# Patient Record
Sex: Female | Born: 1980 | Race: Black or African American | Hispanic: No | Marital: Single | Smoking: Current every day smoker
Health system: Southern US, Community
[De-identification: ages and names within clinical notes are randomized; demographics above are authoritative.]

## PROBLEM LIST (undated history)

## (undated) ENCOUNTER — Emergency Department (HOSPITAL_COMMUNITY): Admission: EM | Payer: Self-pay | Source: Home / Self Care

---

## 2018-11-26 ENCOUNTER — Emergency Department (HOSPITAL_COMMUNITY): Payer: Self-pay

## 2018-11-26 ENCOUNTER — Emergency Department (HOSPITAL_COMMUNITY)
Admission: EM | Admit: 2018-11-26 | Discharge: 2018-11-26 | Disposition: A | Discharge status: Home / Self Care | Payer: Self-pay | Attending: Emergency Medicine | Admitting: Emergency Medicine

## 2018-11-26 ENCOUNTER — Encounter (HOSPITAL_COMMUNITY): Payer: Self-pay

## 2018-11-26 DIAGNOSIS — F17210 Nicotine dependence, cigarettes, uncomplicated: Secondary | ICD-10-CM | POA: Insufficient documentation

## 2018-11-26 DIAGNOSIS — M79671 Pain in right foot: Secondary | ICD-10-CM | POA: Insufficient documentation

## 2018-11-26 NOTE — Discharge Instructions (Addendum)
This affected foot.  Take Tylenol or Motrin as needed for pain.  Follow-up with orthopedics in a week for persistent pain.  Return to the ED immediately for new or worsening symptoms, such as increased pain, numbness, decreased range of motion or any concerns at all.

## 2018-11-26 NOTE — ED Provider Notes (Signed)
[facility]-EMERGENCY DEPT Provider Note   CSN: [id number] Arrival date & time: 11/26/18  1213     History   Chief Complaint Chief Complaint  Patient presents with  . Foot Pain    HPI Joanne Rodriguez is a 38 y.o. female.  HPI   38 year old female presents with a 1 day history of right foot pain.  Patient states that a kitchen drawer fell on her foot last night.  Since then she has had pain to the top of her foot.  She notes pain is worse with ambulating.  She denies any difficulty ambulating, numbness, tingling, decreased range of motion.  She denies any ankle pain or toe pain.  She denies any other injuries.  History reviewed. No pertinent past medical history.  There are no active problems to display for this patient.   History reviewed. No pertinent surgical history.   OB History   No obstetric history on file.      Home Medications    Prior to Admission medications   Not on File    Family History History reviewed. No pertinent family history.  Social History Social History   Tobacco Use  . Smoking status: Current Every Day Smoker    Packs/day: 0.50    Types: Cigarettes  . Smokeless tobacco: Never Used  Substance Use Topics  . Alcohol use: Yes    Alcohol/week: 3.0 standard drinks    Types: 3 Cans of beer per week  . Drug use: Not Currently     Allergies   Patient has no allergy information on record.   Review of Systems Review of Systems  Constitutional: Negative for chills and fever.  Respiratory: Negative for shortness of breath.   Cardiovascular: Negative for chest pain.  Gastrointestinal: Negative for abdominal pain, nausea and vomiting.  Musculoskeletal: Positive for joint swelling (pain to the top of the right foot). Negative for gait problem.     Physical Exam Updated Vital Signs BP 111/73 (BP Location: Right Arm)   Pulse 84   Temp 98.1 F (36.7 C) (Oral)   Resp 16   LMP 11/24/2018   SpO2 100%   Physical  Exam Vitals signs and nursing note reviewed.  Constitutional:      Appearance: She is well-developed.  HENT:     Head: Normocephalic and atraumatic.  Eyes:     Conjunctiva/sclera: Conjunctivae normal.  Neck:     Musculoskeletal: Neck supple.  Cardiovascular:     Rate and Rhythm: Normal rate and regular rhythm.     Pulses:          Dorsalis pedis pulses are 3+ on the right side and 3+ on the left side.     Heart sounds: Normal heart sounds. No murmur.  Pulmonary:     Effort: Pulmonary effort is normal. No respiratory distress.     Breath sounds: Normal breath sounds. No wheezing or rales.  Abdominal:     General: Bowel sounds are normal. There is no distension.     Palpations: Abdomen is soft.     Tenderness: There is no abdominal tenderness.  Musculoskeletal: Normal range of motion.        General: No tenderness or deformity.     Right ankle: She exhibits normal range of motion, no swelling, no ecchymosis and no deformity.     Right foot: Normal range of motion. No deformity or foot drop.     Left foot: Normal range of motion. No deformity or foot drop.  Feet:  Right foot:     Skin integrity: Erythema (small area of ecchymosis to the top of the foot at mid 2nd metatarsal approx 2x3cm) present.  Skin:    General: Skin is warm and dry.     Findings: No erythema or rash.  Neurological:     Mental Status: She is alert and oriented to person, place, and time.  Psychiatric:        Behavior: Behavior normal.      ED Treatments / Results  Labs (all labs ordered are listed, but only abnormal results are displayed) Labs Reviewed - No data to display  EKG None  Radiology Dg Foot Complete Right  Result Date: 11/26/2018 CLINICAL DATA:  Kitchen drawer full of silverware fell onto top of RIGHT foot, pain and swelling over the metatarsals EXAM: RIGHT FOOT COMPLETE - 3+ VIEW COMPARISON:  None FINDINGS: Osseous mineralization normal. Joint spaces preserved. No acute fracture,  dislocation, or bone destruction. IMPRESSION: No acute osseous abnormalities. Electronically Signed   By: Ulyses SouthwardMark  Boles M.D.   On: 11/26/2018 13:20    Procedures Procedures (including critical care time)  Medications Ordered in ED Medications - No data to display   Initial Impression / Assessment and Plan / ED Course  I have reviewed the triage vital signs and the nursing notes.  Pertinent labs & imaging results that were available during my care of the patient were reviewed by me and considered in my medical decision making (see chart for details).     Presented with right foot pain since last night.  Patient resting comfortably in bed, no acute distress, nontoxic, non-lethargic.  Vital signs stable.  Patient has a small area of ecchymosis to the top of her right foot.  X-ray of right foot shows no acute fractures or dislocations.  Patient has no break in the skin, no palpable deformities.  She is nontender over the right ankle and right toes with full range of motion of joints.  She is neurovascularly intact.  Encouraged RICE.  Expressed understanding.  She is ready and stable for discharge.  Final Clinical Impressions(s) / ED Diagnoses   Final diagnoses:  None    ED Discharge Orders    None       Rueben BashKendrick, Kadience Macchi S, PA-C 11/26/18 2153    Donnetta Hutchingook, Brian, MD 11/27/18 [phone]

## 2018-11-26 NOTE — ED Triage Notes (Signed)
Patient c/o right foot pain.  Patient states last night a kitchen drawer fell on top of her right foot.   7/10 pain (throbbing/burning)  A/Ox4 Ambulatory in triage.

## 2019-01-19 ENCOUNTER — Encounter (HOSPITAL_COMMUNITY): Payer: Self-pay

## 2019-01-19 ENCOUNTER — Emergency Department (HOSPITAL_COMMUNITY)
Admission: EM | Admit: 2019-01-19 | Discharge: 2019-01-19 | Disposition: A | Discharge status: Home / Self Care | Payer: Self-pay | Attending: Emergency Medicine | Admitting: Emergency Medicine

## 2019-01-19 ENCOUNTER — Other Ambulatory Visit: Payer: Self-pay

## 2019-01-19 DIAGNOSIS — Y929 Unspecified place or not applicable: Secondary | ICD-10-CM | POA: Insufficient documentation

## 2019-01-19 DIAGNOSIS — Y939 Activity, unspecified: Secondary | ICD-10-CM | POA: Insufficient documentation

## 2019-01-19 DIAGNOSIS — F17210 Nicotine dependence, cigarettes, uncomplicated: Secondary | ICD-10-CM | POA: Insufficient documentation

## 2019-01-19 DIAGNOSIS — Y999 Unspecified external cause status: Secondary | ICD-10-CM | POA: Insufficient documentation

## 2019-01-19 DIAGNOSIS — S025XXA Fracture of tooth (traumatic), initial encounter for closed fracture: Secondary | ICD-10-CM | POA: Insufficient documentation

## 2019-01-19 DIAGNOSIS — K0889 Other specified disorders of teeth and supporting structures: Secondary | ICD-10-CM | POA: Insufficient documentation

## 2019-01-19 DIAGNOSIS — X58XXXA Exposure to other specified factors, initial encounter: Secondary | ICD-10-CM | POA: Insufficient documentation

## 2019-01-19 MED ORDER — AMOXICILLIN 500 MG PO CAPS: 500.0000 mg | ORAL_CAPSULE | Freq: Three times a day (TID) | ORAL | 0 refills | Status: AC

## 2019-01-19 MED ORDER — AMOXICILLIN 500 MG PO CAPS
500.0000 mg | ORAL_CAPSULE | Freq: Once | ORAL | Status: AC
  Administered 2019-01-19: 500 mg via ORAL
  Filled 2019-01-19: qty 1

## 2019-01-19 MED ORDER — LIDOCAINE VISCOUS HCL 2 % MT SOLN: 15.0000 mL | OROMUCOSAL | 0 refills | Status: AC | PRN

## 2019-01-19 MED ORDER — KETOROLAC TROMETHAMINE 60 MG/2ML IM SOLN
60.0000 mg | Freq: Once | INTRAMUSCULAR | Status: AC
  Administered 2019-01-19: 60 mg via INTRAMUSCULAR
  Filled 2019-01-19: qty 2

## 2019-01-19 NOTE — ED Notes (Signed)
Patient verbalizes understanding of discharge instructions. Opportunity for questioning and answers were provided. Armband removed by staff, pt discharged from ED ambulatory to home.  

## 2019-01-19 NOTE — ED Provider Notes (Signed)
[facility] EMERGENCY DEPARTMENT Provider Note   CSN: [id number] Arrival date & time: 01/19/19  2042    History   Chief Complaint Chief Complaint  Patient presents with  . Dental Pain    HPI Joanne Rodriguez is a 38 y.o. female with no pertinent past medical history who presents to the emergency department with a chief complaint of dental pain.  The patient reports part of her right upper tooth broke off 2 days ago.  She reports that she purchased an over-the-counter dental filler and attempted to apply it to the tooth.  She reports that over the last day that her pain has significantly worsened.  She states that she has been unable to fully close her mouth or chew on the right side of her mouth due to the pain.  She has been able to swallow liquids without difficulty.  She denies fevers, chills, purulent drainage, trismus, muffled voice, sore throat, dysphagia, facial or neck swelling.  She has been treating her symptoms by taking 1500 mg of Tylenol 3 times over the last 24 hours.  She reports he was taking Aleve yesterday.  She has been applying warm compresses to the right side of her face with mild improvement of her symptoms.     The history is provided by the patient. No language interpreter was used.    History reviewed. No pertinent past medical history.  There are no active problems to display for this patient.   History reviewed. No pertinent surgical history.   OB History   No obstetric history on file.      Home Medications    Prior to Admission medications   Medication Sig Start Date End Date Taking? Authorizing Provider  amoxicillin (AMOXIL) 500 MG capsule Take 1 capsule (500 mg total) by mouth 3 (three) times daily for 5 days. 01/19/19 01/24/19  ,  A, PA-C  lidocaine (XYLOCAINE) 2 % solution Use as directed 15 mLs in the mouth or throat every 3 (three) hours as needed for mouth pain. 01/19/19   , Coral Else, PA-C    Family  History History reviewed. No pertinent family history.  Social History Social History   Tobacco Use  . Smoking status: Current Every Day Smoker    Packs/day: 0.50    Types: Cigarettes  . Smokeless tobacco: Never Used  Substance Use Topics  . Alcohol use: Yes    Alcohol/week: 3.0 standard drinks    Types: 3 Cans of beer per week  . Drug use: Not Currently     Allergies   Patient has no known allergies.   Review of Systems Review of Systems  Constitutional: Negative for activity change, chills and fever.  HENT: Positive for dental problem. Negative for facial swelling, sore throat and trouble swallowing.   Respiratory: Negative for shortness of breath.   Cardiovascular: Negative for chest pain.  Gastrointestinal: Negative for abdominal pain.  Musculoskeletal: Negative for back pain, neck pain and neck stiffness.  Skin: Negative for rash.     Physical Exam Updated Vital Signs BP (!) 140/97   Pulse 68   Temp 98.7 F (37.1 C) (Oral)   Resp 16   Ht 5\' 7"  (1.702 m)   Wt 70.3 kg   SpO2 100%   BMI 24.28 kg/m   Physical Exam Vitals signs and nursing note reviewed.  Constitutional:      General: She is not in acute distress. HENT:     Head: Normocephalic.     Mouth/Throat:  Mouth: Mucous membranes are moist.     Dentition: Abnormal dentition. Dental tenderness and gingival swelling present. No dental abscesses.     Pharynx: Oropharynx is clear. Uvula midline. No pharyngeal swelling, oropharyngeal exudate, posterior oropharyngeal erythema or uvula swelling.     Tonsils: No tonsillar exudate or tonsillar abscesses.      Comments: White filler material present to tooth 1 and 2. Tooth 1 had sealant that protrudes over the side of the tooth, superiorly toward the gumline.  Gingivitis present around tooth 1 and 2. No gross dental abscess.  Posterior oropharynx is unremarkable.  Good range of motion of the jaw.  She is able to fully bite down with incisors, but less than  1 to 2 mm being able to fully bite down with her molars.  No submental induration. Eyes:     Conjunctiva/sclera: Conjunctivae normal.  Neck:     Musculoskeletal: Normal range of motion and neck supple. No neck rigidity or muscular tenderness.  Cardiovascular:     Rate and Rhythm: Normal rate and regular rhythm.     Heart sounds: No murmur. No friction rub. No gallop.   Pulmonary:     Effort: Pulmonary effort is normal. No respiratory distress.  Abdominal:     General: There is no distension.     Palpations: Abdomen is soft.  Lymphadenopathy:     Cervical: No cervical adenopathy.  Skin:    General: Skin is warm.     Findings: No rash.  Neurological:     Mental Status: She is alert.  Psychiatric:        Behavior: Behavior normal.      ED Treatments / Results  Labs (all labs ordered are listed, but only abnormal results are displayed) Labs Reviewed - No data to display  EKG None  Radiology No results found.  Procedures Procedures (including critical care time)  Medications Ordered in ED Medications  ketorolac (TORADOL) injection 60 mg (60 mg Intramuscular Given 01/19/19 2238)  amoxicillin (AMOXIL) capsule 500 mg (500 mg Oral Given 01/19/19 2238)     Initial Impression / Assessment and Plan / ED Course  I have reviewed the triage vital signs and the nursing notes.  Pertinent labs & imaging results that were available during my care of the patient were reviewed by me and considered in my medical decision making (see chart for details).        38 year old female with no pertinent past medical history presenting with 2 days of dental pain after fracturing a right upper maxillary tooth.  She then applied an over-the-counter sealant to that tooth as well as the adjacent tooth.  On exam, gingival irritation is noted surrounding teeth 1 and 2.  No gross dental abscess.  She is able to tolerate liquids and reports significant improvement in her pain since taking Tylenol  earlier today and removing heat compress from her face.  She has no trismus, muffled voice, dysphagia, or gross dental abscess. Exam unconcerning for Ludwig's angina or spread of infection.  Will treat with amoxacillin and anti-inflammatories.  Patient was offered dental block in the ER, which she declined.  She was treated with Toradol and her first dose of amoxicillin.  She was also counseled on maximum daily Tylenol dosing. Urged patient to follow-up with dentist.  She is hemodynamically stable and in no acute distress.  Return precautions given.  She is safe for discharge home with outpatient follow-up at this time.   Final Clinical Impressions(s) / ED Diagnoses  Final diagnoses:  Dentalgia  Closed fracture of tooth, initial encounter    ED Discharge Orders         Ordered    amoxicillin (AMOXIL) 500 MG capsule  3 times daily     01/19/19 2239    lidocaine (XYLOCAINE) 2 % solution  Every  3 hours PRN     01/19/19 2239           ,  A, PA-C 01/19/19 2249    Vanetta Mulders, MD 01/20/19 [phone]

## 2019-01-19 NOTE — Discharge Instructions (Addendum)
Thank you for allowing me to care for you today in the Emergency Department.   For pain control, you can take 600 mg of ibuprofen with food or 650 mg of Tylenol once every 6 hours for pain control or alternate between these 2 medications every 3 hours.  You can use a 15 mL of lidocaine every 3 hours as needed help with pain in the mouth.  Do not use this medication more than directed due to side effects.  Apply an ice pack for 15 to 20 minutes to areas that are painful as frequently as needed.  Avoid applying heat to the face because although it may initially help with pain and may make her swelling worse.  Do not take more than 4000 mg of Tylenol in a 24-hour period because this can be harmful to your liver.  Swish and spit warm salt water every 6 hours to help prevent worsening infection.  Take 1 tablet of amoxicillin every 8 hours for the next 5 days.  Your first dose was given tonight in the ER.  Make sure to take the entire course of amoxicillin even if you feel as if your symptoms improve unless you are otherwise directed by dentist.  Call Dr. Lacretia Leigh office or use the provided dental resource guide to schedule follow-up appointment with a dentist.  You should return to the emergency department if you develop a high fever, if you feel as if your throat is closing, if you start to have thick, mucus-like drainage from the area, if you develop significant swelling to one side of your neck, or other new, concerning symptoms.

## 2019-01-19 NOTE — ED Triage Notes (Signed)
Pt arrives POV for eval of R sided mouth/dental pain x 2 days. Pt reports chipped tooth and she attempted to fill it w/ temp filler, states pain has worsened and she is unable to eat or close mouth fully. Pt in NARD, managing own secretions in triage.

## 2019-02-24 ENCOUNTER — Other Ambulatory Visit: Payer: Self-pay

## 2019-02-24 ENCOUNTER — Emergency Department (HOSPITAL_COMMUNITY)
Admission: EM | Admit: 2019-02-24 | Discharge: 2019-02-24 | Disposition: A | Discharge status: Home / Self Care | Payer: Self-pay | Attending: Emergency Medicine | Admitting: Emergency Medicine

## 2019-02-24 DIAGNOSIS — F17210 Nicotine dependence, cigarettes, uncomplicated: Secondary | ICD-10-CM | POA: Insufficient documentation

## 2019-02-24 DIAGNOSIS — K047 Periapical abscess without sinus: Secondary | ICD-10-CM | POA: Insufficient documentation

## 2019-02-24 DIAGNOSIS — K029 Dental caries, unspecified: Secondary | ICD-10-CM

## 2019-02-24 MED ORDER — HYDROCODONE-ACETAMINOPHEN 5-325 MG PO TABS
1.0000 | ORAL_TABLET | Freq: Once | ORAL | Status: AC
  Administered 2019-02-24: 1 via ORAL
  Filled 2019-02-24: qty 1

## 2019-02-24 MED ORDER — AMOXICILLIN 500 MG PO CAPS
500.0000 mg | ORAL_CAPSULE | Freq: Once | ORAL | Status: AC
  Administered 2019-02-24: 500 mg via ORAL
  Filled 2019-02-24: qty 1

## 2019-02-24 MED ORDER — AMOXICILLIN 500 MG PO CAPS: 500.0000 mg | ORAL_CAPSULE | Freq: Three times a day (TID) | ORAL | 0 refills | Status: AC

## 2019-02-24 MED ORDER — HYDROCODONE-ACETAMINOPHEN 5-325 MG PO TABS: 2.0000 | ORAL_TABLET | ORAL | 0 refills | Status: AC | PRN

## 2019-02-24 NOTE — Discharge Instructions (Signed)
Your evaluated today for dental pain.  You do have a dental abscess to your right upper molar.  This is actively draining and we did not need to drain this any further in the emergency department.  I given you antibiotics as well as pain medication.  Please take as prescribed.  Please note that pain medication is addictive in nature.  Please only take this as absolutely necessary.  Do not take any additional Tylenol when you are taking his pain medication as this medication has Tylenol in it.  You may continue take ibuprofen as needed for swelling and pain.  I have given you resources for dentistry.  Please call them to schedule follow-up appointment.  Return to the ED for any new or worsening symptoms.

## 2019-02-24 NOTE — ED Triage Notes (Signed)
Pt arrives to ED with c/o of right dental abscess and pain. Pt has pain that radiates into eye and ear.

## 2019-02-24 NOTE — ED Notes (Signed)
Patient verbalizes understanding of discharge instructions. Opportunity for questioning and answers were provided. Armband removed by staff, pt discharged from ED ambulatory to home.  

## 2019-02-24 NOTE — ED Provider Notes (Signed)
[facility] EMERGENCY DEPARTMENT Provider Note   CSN: [id number] Arrival date & time: 02/24/19  1443  History   Chief Complaint Chief Complaint  Patient presents with  . Dental Pain   HPI Joanne Rodriguez is a 38 y.o. female with no significant past medical history who presents for evaluation of dental pain. States she has dental pain located to 2 areas. Patient with dental pain located to her right upper molar as well as her left lower molar.  Patient with known fractured teeth to both these locations.  Patient states she noted area to her right upper jaw was oozing pus and blood yesterday evening.  Patient states she has been able to keep pressing on this area to express this discharge.  Patient states she has pain located to this area which she rates a 7/10.  Patient states that she feels like this pain will sometimes shoot up her face and cause a headache especially when she drinks hot or cold liquids.  Patient states she is not followed by dentistry.  Patient states she did recently try to put dental plaster to her left lower tooth is minimally helped with the pain.  Radiation of pain.  Denies fever, chills, nausea, vomiting, neck pain, neck stiffness, facial asymmetry, drooling, dysphasia, trismus, chest pain or shortness of breath.  Has not taken anything for pain PTA.  Denies additional aggravating or alleviating factors.  History obtained from patient.  No interpreter was used.     HPI  No past medical history on file.  There are no active problems to display for this patient.   No past surgical history on file.   OB History   No obstetric history on file.      Home Medications    Prior to Admission medications   Medication Sig Start Date End Date Taking? Authorizing Provider  amoxicillin (AMOXIL) 500 MG capsule Take 1 capsule (500 mg total) by mouth 3 (three) times daily for 7 days. 02/24/19 03/03/19  Aleshia Cartelli A, PA-C  HYDROcodone-acetaminophen  (NORCO/VICODIN) 5-325 MG tablet Take 2 tablets by mouth every 4 (four) hours as needed. 02/24/19   Delorean Knutzen A, PA-C  lidocaine (XYLOCAINE) 2 % solution Use as directed 15 mLs in the mouth or throat every 3 (three) hours as needed for mouth pain. 01/19/19   McDonald, Mia A, PA-C    Family History No family history on file.  Social History Social History   Tobacco Use  . Smoking status: Current Every Day Smoker    Packs/day: 0.50    Types: Cigarettes  . Smokeless tobacco: Never Used  Substance Use Topics  . Alcohol use: Yes    Alcohol/week: 3.0 standard drinks    Types: 3 Cans of beer per week  . Drug use: Not Currently     Allergies   Patient has no known allergies.   Review of Systems Review of Systems  Constitutional: Negative.   HENT: Positive for dental problem. Negative for drooling, ear discharge, ear pain, facial swelling, hearing loss, mouth sores, postnasal drip, rhinorrhea, sinus pressure, sinus pain, sneezing, sore throat, tinnitus and trouble swallowing.   Eyes: Negative.   Respiratory: Negative.   Cardiovascular: Negative.   Gastrointestinal: Negative.   Genitourinary: Negative.   Musculoskeletal: Negative.   Skin: Negative.   Neurological: Negative.   All other systems reviewed and are negative.    Physical Exam Updated Vital Signs BP (!) 143/94 (BP Location: Right Arm)   Pulse 71   Temp 98.5  F (36.9 C) (Oral)   Resp 18   SpO2 100%   Physical Exam Vitals signs and nursing note reviewed.  Constitutional:      General: She is not in acute distress.    Appearance: She is well-developed. She is not ill-appearing, toxic-appearing or diaphoretic.  HENT:     Head: Normocephalic and atraumatic.     Jaw: There is normal jaw occlusion.     Comments: No facial swelling. No drooling, dysphasia or trismus.  No facial tenderness.    Right Ear: Tympanic membrane, ear canal and external ear normal. No laceration, drainage, swelling or tenderness.  Tympanic membrane is not scarred, perforated, erythematous, retracted or bulging.     Left Ear: Tympanic membrane, ear canal and external ear normal. No laceration, drainage, swelling or tenderness. Tympanic membrane is not scarred, perforated, erythematous, retracted or bulging.     Nose: Nose normal.     Right Sinus: No maxillary sinus tenderness or frontal sinus tenderness.     Left Sinus: No maxillary sinus tenderness or frontal sinus tenderness.     Mouth/Throat:      Comments: Oropharynx clear.  Mucous membranes moist.  Uvula midline without deviation.  No evidence of tonsillar edema or exudate.  Overall poor dentition with multiple dental caries and fractured teeth. There is actively draining periapical abscess surrounding tooth #2. Mild gingival erythema.  She does have a fractured tooth at this location.  Fractured teeth tooth #18 and 19.  No evidence of gingival erythema.  Periapical abscess to this area.  Sublingual area soft.  No submandibular swelling. Eyes:     Pupils: Pupils are equal, round, and reactive to light.  Neck:     Musculoskeletal: Normal range of motion.     Comments: No neck stiffness or neck rigidity.  No meningismus.  Phonation normal. Cardiovascular:     Rate and Rhythm: Normal rate.     Pulses: Normal pulses.     Heart sounds: Normal heart sounds.  Pulmonary:     Effort: No respiratory distress.     Comments: Clear to auscultation bilateral without wheeze, rhonchi or rales. Abdominal:     General: There is no distension.  Musculoskeletal: Normal range of motion.  Skin:    General: Skin is warm and dry.     Comments: No rashes or lesions.  Neurological:     Mental Status: She is alert.     Comments: No facial droop. Cranial nerves II through XII grossly intact. Ambulatory in department that difficulty.    ED Treatments / Results  Labs (all labs ordered are listed, but only abnormal results are displayed) Labs Reviewed - No data to display  EKG None   Radiology No results found.  Procedures Procedures (including critical care time)  Medications Ordered in ED Medications  HYDROcodone-acetaminophen (NORCO/VICODIN) 5-325 MG per tablet 1 tablet (1 tablet Oral Given 02/24/19 1531)  amoxicillin (AMOXIL) capsule 500 mg (500 mg Oral Given 02/24/19 1531)    Initial Impression / Assessment and Plan / ED Course  I have reviewed the triage vital signs and the nursing notes.  Pertinent labs & imaging results that were available during my care of the patient were reviewed by me and considered in my medical decision making (see chart for details).  38 year old female appears otherwise well presents for evaluation of dental pain.  Afebrile, nonseptic, non-ill-appearing.  No facial swelling. No headache currently. No drooling, dysphasia or trismus.  Posterior oropharynx clear.  Mucous membranes moist.  Uvula midline  without deviation.  No facial rashes or tenderness to palpation.  Patient with actively draining periapical abscess to tooth number 2.  She also has multiple fractured teeth and overall poor dentition.  She has tenderness surrounding tooth #19 and 18 however does not have any evidence of periapical abscess to this area.  Gingiva without erythema to this area.  Sublingual area soft.  No submandibular swelling. Low suspicion for deep space infection or Ludwig's angina.  Given abscess is actively draining.  Do not feel we need to actively I&D this area.  Able to tolerate p.o. intake without difficulty.  Will DC home with pain medication, antibiotics as well as dental resources.  Reviewed [facility] narcotics database and she does not have an active narcotic prescription.  Patient hemodynamically stable and appropriate for DC home this time.  I have discussed strict return precautions.  Patient voiced understanding and is agreeable to follow-up.     Final Clinical Impressions(s) / ED Diagnoses   Final diagnoses:  Periapical abscess  Dental  caries    ED Discharge Orders         Ordered    HYDROcodone-acetaminophen (NORCO/VICODIN) 5-325 MG tablet  Every 4 hours PRN     02/24/19 1528    amoxicillin (AMOXIL) 500 MG capsule  3 times daily     02/24/19 1528           Quintel Mccalla A, PA-C 02/24/19 1544    Benjiman Core, MD 02/25/19 1133

## 2020-02-26 IMAGING — CR DG FOOT COMPLETE 3+V*R*
3 series · 3 of 3 positions shown · non-contrast
Comparison: None

CLINICAL DATA: Kitchen drawer full of silverware fell onto top of
RIGHT foot, pain and swelling over the metatarsals

EXAM:
RIGHT FOOT COMPLETE - 3+ VIEW

[x foot ap right]
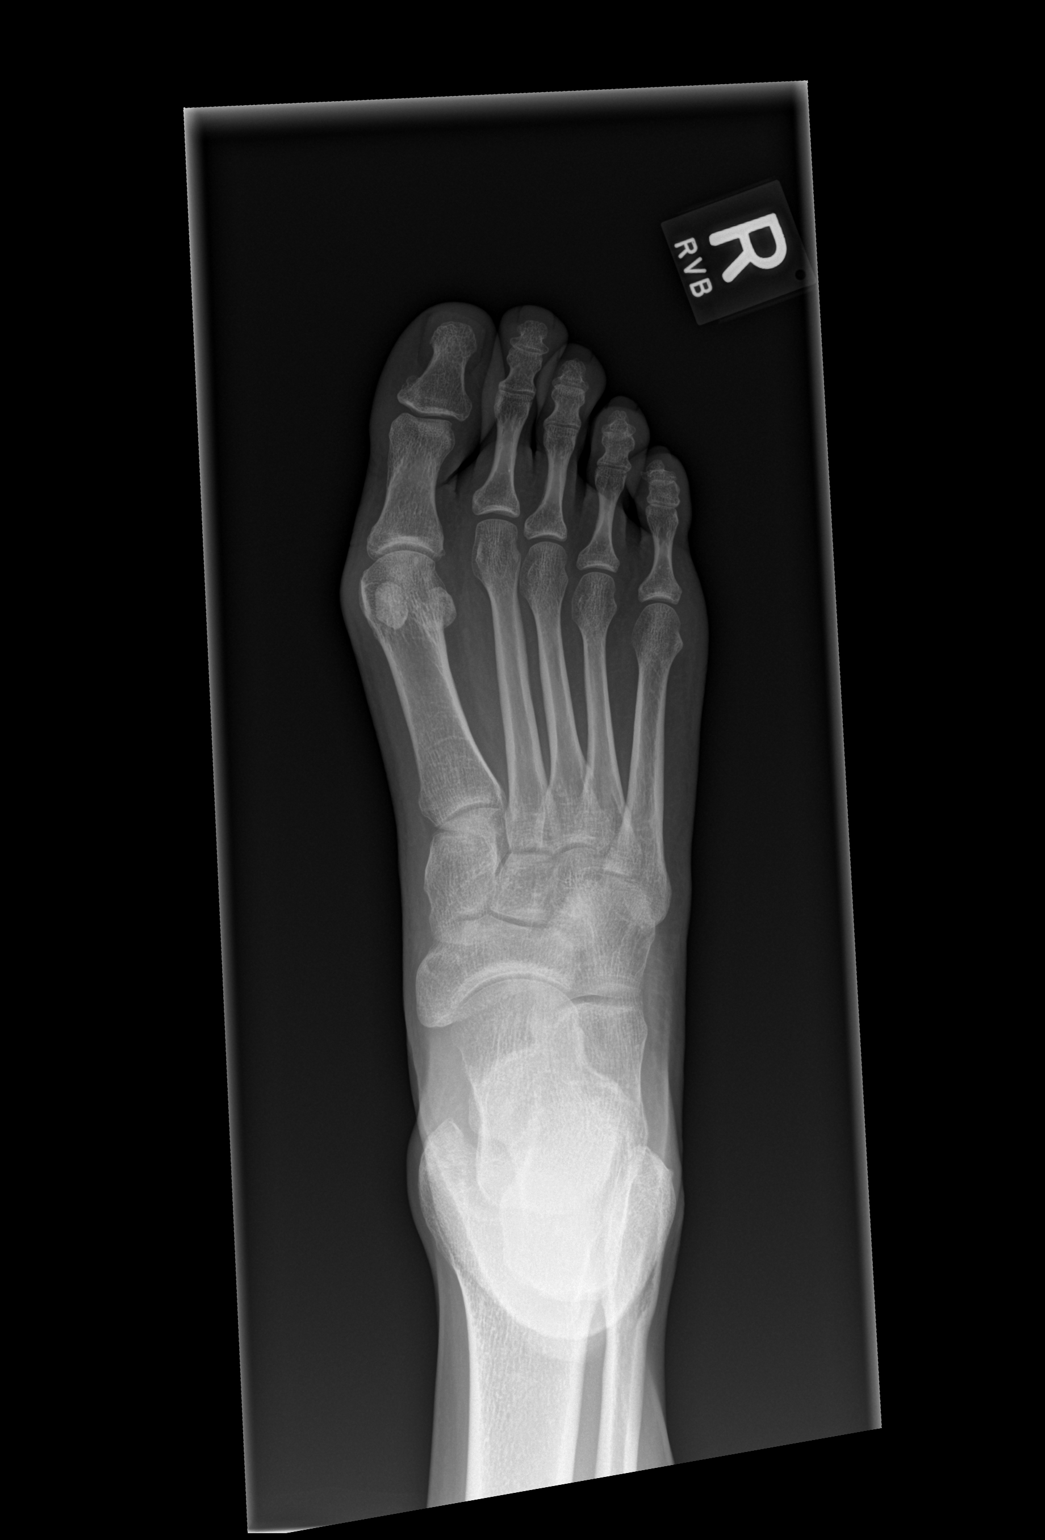

[x foot obl right]
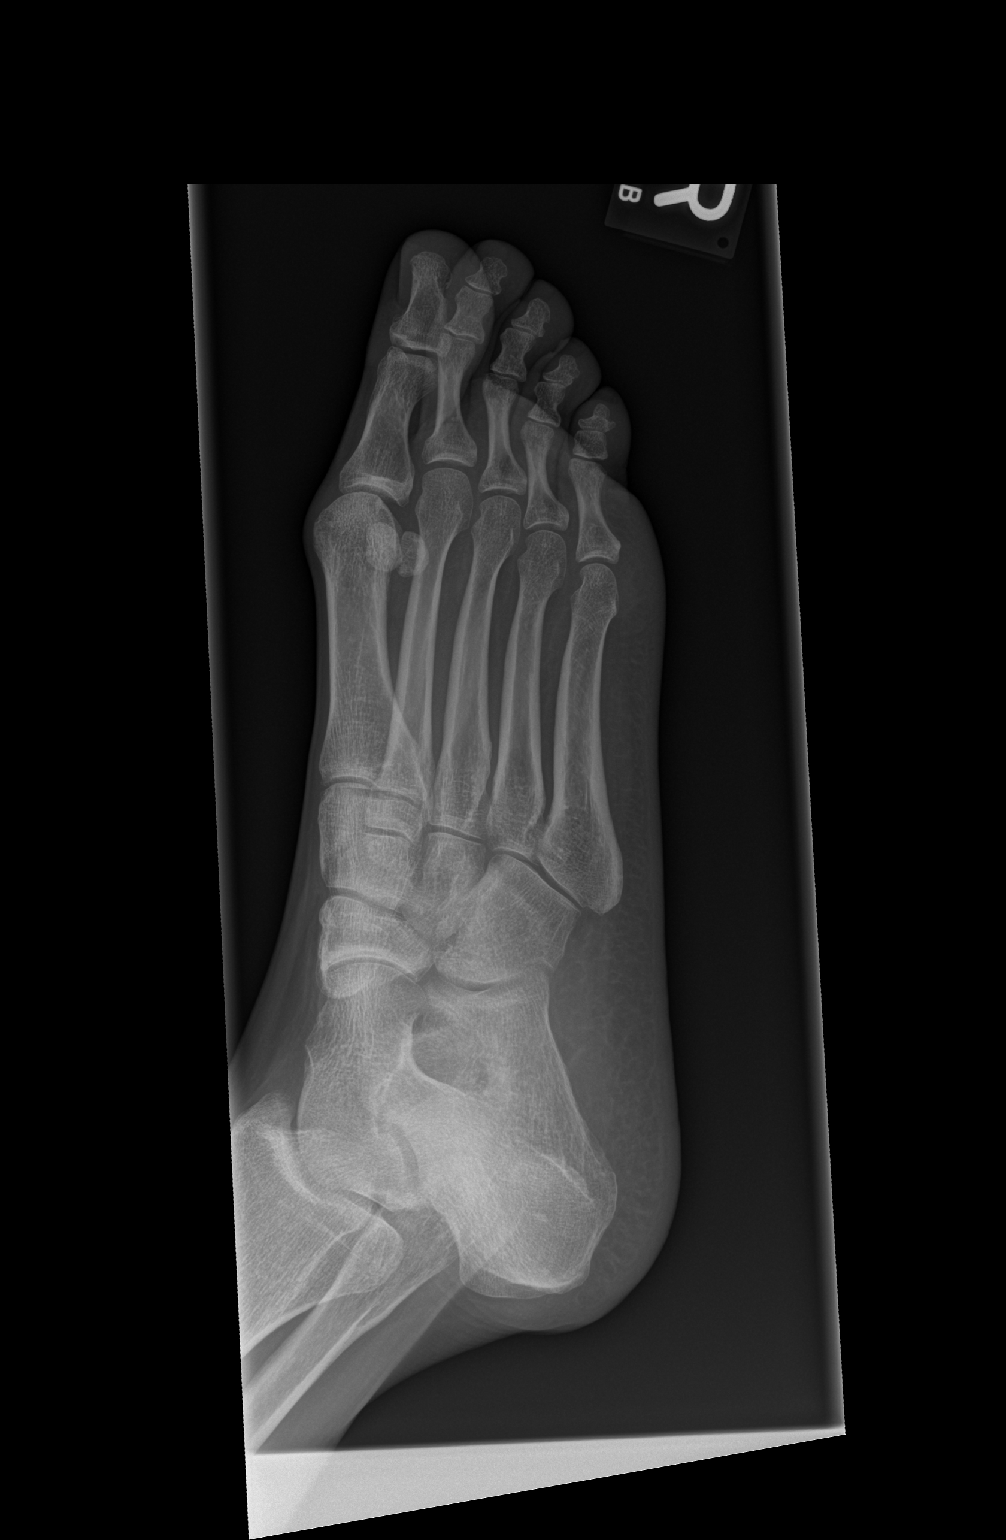

[x foot lat right]
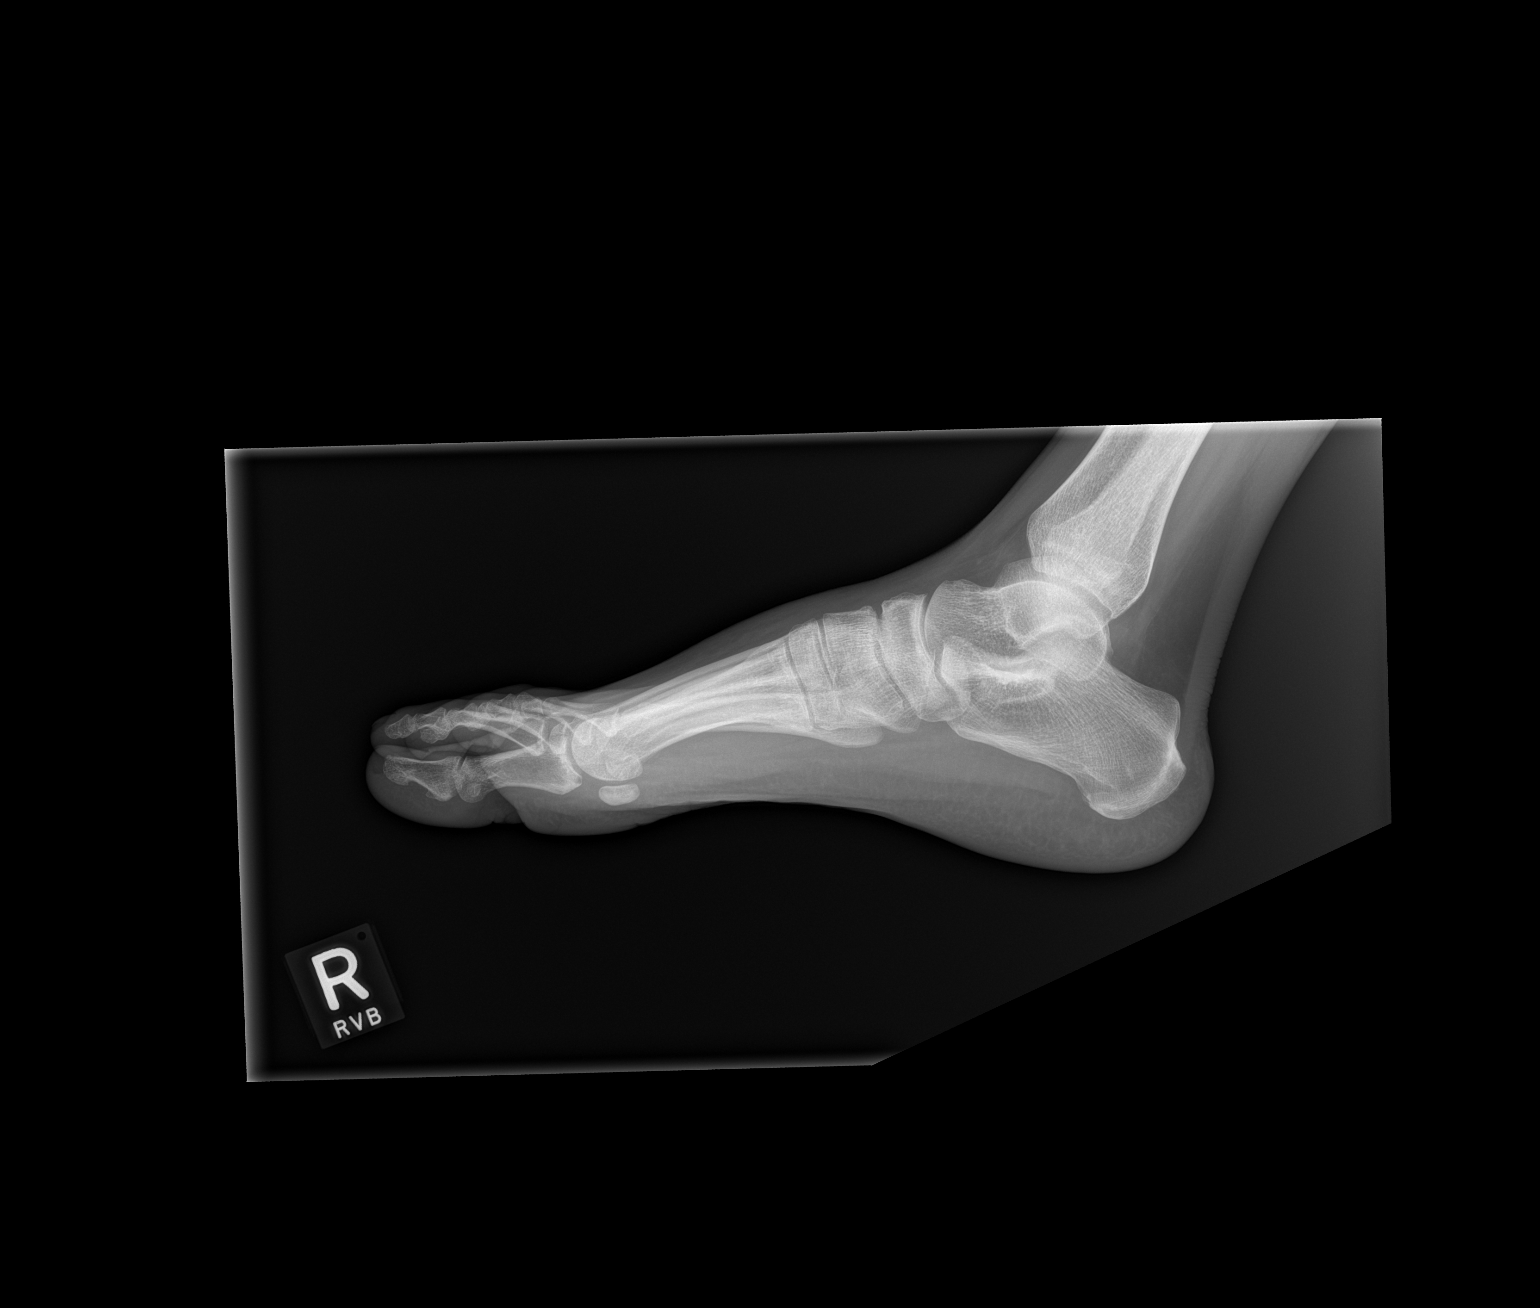

[3 of 3 positions shown; findings below may reference images not displayed]

FINDINGS: Osseous mineralization normal.

Joint spaces preserved.

No acute fracture, dislocation, or bone destruction.
IMPRESSION: No acute osseous abnormalities.
